# Patient Record
Sex: Male | Born: 2000
Health system: Southern US, Community
[De-identification: ages and names within clinical notes are randomized; demographics above are authoritative.]

## PROBLEM LIST (undated history)

## (undated) DIAGNOSIS — F909 Attention-deficit hyperactivity disorder, unspecified type: Secondary | ICD-10-CM

## (undated) HISTORY — DX: Attention-deficit hyperactivity disorder, unspecified type: F90.9

---
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
## 2023-01-14 ENCOUNTER — Emergency Department (HOSPITAL_COMMUNITY)
Admission: EM | Admit: 2023-01-14 | Discharge: 2023-01-14 | Disposition: A | Discharge status: Home / Self Care | Payer: Self-pay | Attending: Emergency Medicine | Admitting: Emergency Medicine

## 2023-01-14 ENCOUNTER — Other Ambulatory Visit: Payer: Self-pay

## 2023-01-14 DIAGNOSIS — M79641 Pain in right hand: Secondary | ICD-10-CM

## 2023-01-14 DIAGNOSIS — S60422A Blister (nonthermal) of right middle finger, initial encounter: Secondary | ICD-10-CM | POA: Insufficient documentation

## 2023-01-14 DIAGNOSIS — X18XXXA Contact with other hot metals, initial encounter: Secondary | ICD-10-CM | POA: Diagnosis not present

## 2023-01-14 MED ORDER — MINERIN CREME EX CREA: 1.0000 | TOPICAL_CREAM | Freq: Two times a day (BID) | CUTANEOUS | 0 refills | Status: AC

## 2023-01-14 NOTE — Discharge Instructions (Signed)
Put a band aide on the blister.   Follow up if not improving with a family md.  We will prescribe a cream for your thumb

## 2023-01-14 NOTE — ED Triage Notes (Signed)
Pt reports he picked up a hot piece of metal at work this morning around 5am and sustained blister to tip of right middle finger and redness to tip of right ring finger. No other injuries reported.

## 2023-01-14 NOTE — ED Provider Notes (Signed)
  [name] EMERGENCY DEPARTMENT AT [facility] Provider Note   CSN: [id number] Arrival date & time: 01/14/23  4098     History {Add pertinent medical, surgical, social history, OB history to HPI:1} Chief Complaint  Patient presents with   Blister    Brett Wilkinson is a 22 y.o. male.  Patient presents with a small blister to the end of his middle finger on his right hand.   Rash      Home Medications Prior to Admission medications   Medication Sig Start Date End Date Taking? Authorizing Provider  Skin Protectants, Misc. (MINERIN CREME) CREA Apply 1 Application topically 2 (two) times daily. 01/14/23  Yes Bethann Berkshire, MD  famotidine (PEPCID) 20 MG tablet Take 1 tablet (20 mg total) by mouth 2 (two) times daily. 09/30/18   Dettinger, Elige Radon, MD      Allergies    Zithromax [azithromycin]    Review of Systems   Review of Systems  Skin:  Positive for rash.    Physical Exam Updated Vital Signs BP (!) 144/81   Pulse 80   Temp 98.3 F (36.8 C) (Oral)   Resp 18   Ht 5\' 10"  (1.778 m)   Wt 123.4 kg   SpO2 98%   BMI 39.03 kg/m  Physical Exam  ED Results / Procedures / Treatments   Labs (all labs ordered are listed, but only abnormal results are displayed) Labs Reviewed - No data to display  EKG None  Radiology No results found.  Procedures Procedures  {Document cardiac monitor, telemetry assessment procedure when appropriate:1}  Medications Ordered in ED Medications - No data to display  ED Course/ Medical Decision Making/ A&P   {   Click here for ABCD2, HEART and other calculatorsREFRESH Note before signing :1}                              Medical Decision Making Risk OTC drugs.   Posterior to middle finger of right hand and dry cracking skin to left thumb.  Patient will cover the blister with a Band-Aid and is given Eucerin cream for his thumb  {Document critical care time when appropriate:1} {Document review of labs and clinical  decision tools ie heart score, Chads2Vasc2 etc:1}  {Document your independent review of radiology images, and any outside records:1} {Document your discussion with family members, caretakers, and with consultants:1} {Document social determinants of health affecting pt's care:1} {Document your decision making why or why not admission, treatments were needed:1} Final Clinical Impression(s) / ED Diagnoses Final diagnoses:  Hand pain, right    Rx / DC Orders ED Discharge Orders          Ordered    Skin Protectants, Misc. [facility] CREME) CREA  2 times daily        01/14/23 0718
